# Patient Record
Sex: Male | Born: 1960 | Race: White | Hispanic: No | Marital: Married | Smoking: Current every day smoker
Health system: Southern US, Community
[De-identification: ages and names within clinical notes are randomized; demographics above are authoritative.]

## PROBLEM LIST (undated history)

## (undated) HISTORY — PX: KNEE SURGERY: SHX244

## (undated) HISTORY — PX: CHOLECYSTECTOMY: SHX55

---

## 1999-02-26 ENCOUNTER — Emergency Department (HOSPITAL_COMMUNITY): Admission: EM | Admit: 1999-02-26 | Discharge: 1999-02-26 | Payer: Self-pay | Admitting: Emergency Medicine

## 2000-12-08 ENCOUNTER — Emergency Department (HOSPITAL_COMMUNITY): Admission: EM | Admit: 2000-12-08 | Discharge: 2000-12-08 | Payer: Self-pay | Admitting: Emergency Medicine

## 2003-11-18 ENCOUNTER — Emergency Department (HOSPITAL_COMMUNITY): Admission: EM | Admit: 2003-11-18 | Discharge: 2003-11-19 | Payer: Self-pay | Admitting: Emergency Medicine

## 2003-12-30 ENCOUNTER — Emergency Department: Payer: Self-pay | Admitting: Emergency Medicine

## 2004-08-13 ENCOUNTER — Emergency Department (HOSPITAL_COMMUNITY): Admission: EM | Admit: 2004-08-13 | Discharge: 2004-08-13 | Payer: Self-pay | Admitting: Family Medicine

## 2005-03-14 ENCOUNTER — Emergency Department (HOSPITAL_COMMUNITY): Admission: EM | Admit: 2005-03-14 | Discharge: 2005-03-14 | Payer: Self-pay | Admitting: Emergency Medicine

## 2006-10-03 ENCOUNTER — Emergency Department: Payer: Self-pay | Admitting: Emergency Medicine

## 2006-10-04 ENCOUNTER — Emergency Department: Payer: Self-pay | Admitting: Emergency Medicine

## 2007-02-18 ENCOUNTER — Emergency Department (HOSPITAL_COMMUNITY): Admission: EM | Admit: 2007-02-18 | Discharge: 2007-02-19 | Payer: Self-pay | Admitting: Emergency Medicine

## 2008-01-30 ENCOUNTER — Emergency Department (HOSPITAL_COMMUNITY): Admission: EM | Admit: 2008-01-30 | Discharge: 2008-01-30 | Payer: Self-pay | Admitting: Emergency Medicine

## 2008-06-07 ENCOUNTER — Emergency Department: Payer: Self-pay | Admitting: Emergency Medicine

## 2010-11-02 ENCOUNTER — Encounter: Payer: Self-pay | Admitting: *Deleted

## 2010-11-02 ENCOUNTER — Emergency Department (HOSPITAL_COMMUNITY)
Admission: EM | Admit: 2010-11-02 | Discharge: 2010-11-02 | Disposition: A | Discharge status: Home / Self Care | Payer: BC Managed Care – PPO | Attending: Emergency Medicine | Admitting: Emergency Medicine

## 2010-11-02 DIAGNOSIS — R05 Cough: Secondary | ICD-10-CM | POA: Insufficient documentation

## 2010-11-02 DIAGNOSIS — R52 Pain, unspecified: Secondary | ICD-10-CM | POA: Insufficient documentation

## 2010-11-02 DIAGNOSIS — R059 Cough, unspecified: Secondary | ICD-10-CM | POA: Insufficient documentation

## 2010-11-02 DIAGNOSIS — R062 Wheezing: Secondary | ICD-10-CM | POA: Insufficient documentation

## 2010-11-02 DIAGNOSIS — J3489 Other specified disorders of nose and nasal sinuses: Secondary | ICD-10-CM | POA: Insufficient documentation

## 2010-11-02 DIAGNOSIS — F17200 Nicotine dependence, unspecified, uncomplicated: Secondary | ICD-10-CM | POA: Insufficient documentation

## 2010-11-02 DIAGNOSIS — R509 Fever, unspecified: Secondary | ICD-10-CM | POA: Insufficient documentation

## 2010-11-02 DIAGNOSIS — R51 Headache: Secondary | ICD-10-CM | POA: Insufficient documentation

## 2010-11-02 DIAGNOSIS — J40 Bronchitis, not specified as acute or chronic: Secondary | ICD-10-CM

## 2010-11-02 DIAGNOSIS — R070 Pain in throat: Secondary | ICD-10-CM | POA: Insufficient documentation

## 2010-11-02 MED ORDER — ALBUTEROL SULFATE HFA 108 (90 BASE) MCG/ACT IN AERS
2.0000 | INHALATION_SPRAY | Freq: Once | RESPIRATORY_TRACT | Status: AC
  Administered 2010-11-02: 2 via RESPIRATORY_TRACT
  Filled 2010-11-02: qty 6.7

## 2010-11-02 MED ORDER — AZITHROMYCIN 250 MG PO TABS: ORAL_TABLET | ORAL | Status: DC

## 2010-11-02 MED ORDER — GUAIFENESIN-CODEINE 100-10 MG/5ML PO SYRP: 10.0000 mL | ORAL_SOLUTION | Freq: Three times a day (TID) | ORAL | Status: AC | PRN

## 2010-11-02 MED ORDER — HYDROCOD POLST-CHLORPHEN POLST 10-8 MG/5ML PO LQCR
5.0000 mL | Freq: Once | ORAL | Status: AC
  Administered 2010-11-02: 5 mL via ORAL
  Filled 2010-11-02: qty 5

## 2010-11-02 NOTE — ED Notes (Signed)
Pt has multiple complaints, pt c/o body aches, headache and chills; pt states he has been coughing up thick yellow phlem

## 2010-11-02 NOTE — ED Provider Notes (Signed)
History     CSN: [id number] Arrival date & time: 11/02/2010  8:12 AM  Chief Complaint  Patient presents with  . Generalized Body Aches  . Cough  . Chills  . Headache    (Consider location/radiation/quality/duration/timing/severity/associated sxs/prior treatment) Patient is a 50 y.o. male presenting with cough and headaches. The history is provided by the patient.  Cough The current episode started more than 2 days ago. The problem occurs every few minutes. The problem has been gradually worsening. The cough is productive of sputum. There has been no fever. Associated symptoms include chills, ear congestion, headaches, rhinorrhea, sore throat, myalgias and wheezing. Pertinent negatives include no chest pain, no sweats and no shortness of breath. Treatments tried: OTC cold medication. The treatment provided no relief. He is a smoker. His past medical history is significant for bronchitis. His past medical history does not include pneumonia, COPD, emphysema or asthma.  Headache  Associated symptoms include a fever. Pertinent negatives include no shortness of breath, no nausea and no vomiting.    History reviewed. No pertinent past medical history.  Past Surgical History  Procedure Date  . Knee surgery right    History reviewed. No pertinent family history.  History  Substance Use Topics  . Smoking status: Current Everyday Smoker  . Smokeless tobacco: Not on file  . Alcohol Use: No      Review of Systems  Constitutional: Positive for fever and chills. Negative for appetite change.  HENT: Positive for congestion, sore throat and rhinorrhea. Negative for trouble swallowing, neck pain and neck stiffness.   Eyes: Negative for pain and visual disturbance.  Respiratory: Positive for cough and wheezing. Negative for chest tightness and shortness of breath.   Cardiovascular: Negative for chest pain.  Gastrointestinal: Negative for nausea, vomiting, abdominal pain and diarrhea.    Musculoskeletal: Positive for myalgias.  Skin: Negative.   Neurological: Positive for headaches. Negative for dizziness, weakness and numbness.  Hematological: Negative for adenopathy. Does not bruise/bleed easily.  All other systems reviewed and are negative.    Allergies  Review of patient's allergies indicates no known allergies.  Home Medications  No current outpatient prescriptions on file.  BP 112/73  Pulse 78  Temp(Src) 98.2 F (36.8 C) (Oral)  Resp 20  Ht 6\' 4"  (1.93 m)  Wt 210 lb (95.255 kg)  BMI 25.56 kg/m2  SpO2 97%  Physical Exam  Constitutional: He is oriented to person, place, and time. He appears well-developed and well-nourished.  Non-toxic appearance. He does not have a sickly appearance. He does not appear ill. No distress.  HENT:  Head: Normocephalic and atraumatic.  Right Ear: Tympanic membrane normal.  Left Ear: Tympanic membrane normal.  Nose: Mucosal edema present. No rhinorrhea.  Mouth/Throat: Uvula is midline. No uvula swelling. Posterior oropharyngeal erythema present. No oropharyngeal exudate, posterior oropharyngeal edema or tonsillar abscesses.  Eyes: EOM are normal. Pupils are equal, round, and reactive to light. Right eye exhibits no discharge. Left eye exhibits no discharge.  Neck: Normal range of motion. Neck supple.  Cardiovascular: Normal rate, regular rhythm and normal heart sounds.   Pulmonary/Chest: Effort normal. No respiratory distress. He has wheezes. He has no rales. He exhibits no tenderness.  Abdominal: Soft. He exhibits no distension. There is no tenderness. There is no rebound and no guarding.  Musculoskeletal: Normal range of motion.  Lymphadenopathy:    He has no cervical adenopathy.  Neurological: He is alert and oriented to person, place, and time. No cranial nerve deficit. He exhibits  normal muscle tone. Coordination normal.  Skin: Skin is warm and dry.    ED Course  Procedures (including critical care  time)       MDM    8:41 AM Vitals are stable.  NAD.  Patient is non-toxic appearing.  No meningeal signs. No tachycardia, tachypnea or hypoxia.   Few scattered expiratory wheezes and productive cough, likely bronchitis.    I will start pt on bronchodilator and abx.  He agrees to return here if sx's worsen.    Pt feels improved after observation and/or treatment in ED.         Mitchell Epling L. [place], [place] 11/02/10 [phone]

## 2010-11-02 NOTE — ED Provider Notes (Signed)
Medical screening examination/treatment/procedure(s) were performed by non-physician practitioner and as supervising physician I was immediately available for consultation/collaboration.   Forbes Cellar, MD 11/02/10 [phone]

## 2010-11-02 NOTE — ED Notes (Addendum)
C/o Headache, Generalized achiness, sore throat, bilat ear pain, cough productive of yellow secretions, back and rib pain, chills alternating with feeling warm.  Oral temp retaken  98.3   Rates overall pain an 8 on 1-10 scale.

## 2011-03-05 ENCOUNTER — Emergency Department: Payer: Self-pay | Admitting: Emergency Medicine

## 2011-11-08 ENCOUNTER — Emergency Department (HOSPITAL_COMMUNITY): Payer: BC Managed Care – PPO

## 2011-11-08 ENCOUNTER — Emergency Department (HOSPITAL_COMMUNITY)
Admission: EM | Admit: 2011-11-08 | Discharge: 2011-11-08 | Disposition: A | Discharge status: Home / Self Care | Payer: BC Managed Care – PPO | Attending: Emergency Medicine | Admitting: Emergency Medicine

## 2011-11-08 ENCOUNTER — Encounter (HOSPITAL_COMMUNITY): Payer: Self-pay | Admitting: *Deleted

## 2011-11-08 DIAGNOSIS — IMO0002 Reserved for concepts with insufficient information to code with codable children: Secondary | ICD-10-CM | POA: Insufficient documentation

## 2011-11-08 DIAGNOSIS — S86919A Strain of unspecified muscle(s) and tendon(s) at lower leg level, unspecified leg, initial encounter: Secondary | ICD-10-CM

## 2011-11-08 DIAGNOSIS — W108XXA Fall (on) (from) other stairs and steps, initial encounter: Secondary | ICD-10-CM | POA: Insufficient documentation

## 2011-11-08 MED ORDER — HYDROCODONE-ACETAMINOPHEN 5-325 MG PO TABS: 1.0000 | ORAL_TABLET | ORAL | Status: DC

## 2011-11-08 NOTE — ED Notes (Signed)
Patient with no complaints at this time. Respirations even and unlabored. Skin warm/dry. Discharge instructions reviewed with patient at this time. Patient given opportunity to voice concerns/ask questions. Patient discharged at this time and left Emergency Department with steady gait.   

## 2011-11-08 NOTE — ED Notes (Signed)
Slipped when coming down steps, twisted lt knee, pain lt knee and lower leg.

## 2011-11-08 NOTE — ED Provider Notes (Signed)
History     CSN: [id number]  Arrival date & time 11/08/11  1518   First MD Initiated Contact with Patient 11/08/11 1549      Chief Complaint  Patient presents with  . Fall    (Consider location/radiation/quality/duration/timing/severity/associated sxs/prior treatment) Patient is a 51 y.o. male presenting with fall. The history is provided by the patient.  Fall The accident occurred 3 to 5 hours ago. Incident: walking down steps. He fell from a height of 1 to 2 ft. He landed on grass. There was no blood loss. The point of impact was the right knee (right hip). The pain is present in the right hip and right knee. The pain is moderate. He was ambulatory at the scene. There was no alcohol use involved in the accident. Pertinent negatives include no fever, no abdominal pain, no bowel incontinence, no hematuria and no loss of consciousness. The symptoms are aggravated by standing. He has tried nothing for the symptoms.    History reviewed. No pertinent past medical history.  Past Surgical History  Procedure Date  . Knee surgery right    History reviewed. No pertinent family history.  History  Substance Use Topics  . Smoking status: Current Every Day Smoker  . Smokeless tobacco: Not on file  . Alcohol Use: No      Review of Systems  Constitutional: Negative for fever and activity change.       All ROS Neg except as noted in HPI  HENT: Negative for nosebleeds and neck pain.   Eyes: Negative for photophobia and discharge.  Respiratory: Negative for cough, shortness of breath and wheezing.   Cardiovascular: Negative for chest pain and palpitations.  Gastrointestinal: Negative for abdominal pain, blood in stool and bowel incontinence.  Genitourinary: Negative for dysuria, frequency and hematuria.  Musculoskeletal: Positive for arthralgias. Negative for back pain.  Skin: Negative.   Neurological: Negative for dizziness, seizures, loss of consciousness and speech difficulty.    Psychiatric/Behavioral: Negative for hallucinations and confusion.    Allergies  Naproxen; Prednisone; and Tramadol  Home Medications  No current outpatient prescriptions on file.  BP 138/80  Pulse 68  Temp 97.7 F (36.5 C) (Oral)  Resp 18  Ht 6\' 4"  (1.93 m)  Wt 205 lb (92.987 kg)  BMI 24.95 kg/m2  SpO2 98%  Physical Exam  Nursing note and vitals reviewed. Constitutional: He is oriented to person, place, and time. He appears well-developed and well-nourished.  Non-toxic appearance.  HENT:  Head: Normocephalic.  Right Ear: Tympanic membrane and external ear normal.  Left Ear: Tympanic membrane and external ear normal.  Eyes: EOM and lids are normal. Pupils are equal, round, and reactive to light.  Neck: Normal range of motion. Neck supple. Carotid bruit is not present.  Cardiovascular: Normal rate, regular rhythm, normal heart sounds, intact distal pulses and normal pulses.   Pulmonary/Chest: Breath sounds normal. No respiratory distress.  Abdominal: Soft. Bowel sounds are normal. There is no tenderness. There is no guarding.  Musculoskeletal: Normal range of motion.       There is pain to palpation and attempted range of motion of the left hip. There is pain to the posterior lateral left knee. There is soreness of the left tib-fib area. Dorsalis pedis pulse is 2+ and symmetrical. Sensory is within normal limits.  Lymphadenopathy:       Head (right side): No submandibular adenopathy present.       Head (left side): No submandibular adenopathy present.    He has  no cervical adenopathy.  Neurological: He is alert and oriented to person, place, and time. He has normal strength. No cranial nerve deficit or sensory deficit.  Skin: Skin is warm and dry.  Psychiatric: He has a normal mood and affect. His speech is normal.    ED Course  Procedures (including critical care time)  Labs Reviewed - No data to display Dg Hip Complete Left  11/08/2011  *RADIOLOGY REPORT*  Clinical  Data: Larey Seat down stairs  LEFT HIP - COMPLETE 2+ VIEW  Comparison: None.  Findings: Negative for fracture.  Normal alignment.  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  Negative for AVN.  Degenerative disc disease at L4-5.  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  Negative for fracture.   Original Report Authenticated By: Camelia Phenes, M.D.      No diagnosis found.    MDM  I have reviewed nursing notes, vital signs, and all appropriate lab and imaging results for this patient. The x-ray of the left hip and pelvis negative for fracture or dislocation. There is degenerative changes of the left hip joint. The x-ray of the left tib-fib area is negative for fracture or dislocation. The patient is fitted with a knee immobilizer and crutches. He is advised to use ice to the affected area. He is treated with Norco one every 4 hours as needed for pain #15.       Kathie Dike, [place] 11/08/11 1645

## 2011-11-09 NOTE — ED Provider Notes (Signed)
Medical screening examination/treatment/procedure(s) were performed by non-physician practitioner and as supervising physician I was immediately available for consultation/collaboration.   [place] M Lillyth Spong, MD 11/09/11 1803 

## 2012-03-26 ENCOUNTER — Emergency Department: Payer: Self-pay | Admitting: Emergency Medicine

## 2012-03-26 LAB — CBC
HCT: 46.1 % (ref 40.0–52.0)
MCH: 31.2 pg (ref 26.0–34.0)
MCHC: 34.2 g/dL (ref 32.0–36.0)
MCV: 91 fL (ref 80–100)
Platelet: 223 10*3/uL (ref 150–440)
RBC: 5.04 10*6/uL (ref 4.40–5.90)
WBC: 10.3 10*3/uL (ref 3.8–10.6)

## 2012-03-26 LAB — COMPREHENSIVE METABOLIC PANEL
Albumin: 4.1 g/dL (ref 3.4–5.0)
Alkaline Phosphatase: 68 U/L (ref 50–136)
Anion Gap: 10 (ref 7–16)
Calcium, Total: 8.9 mg/dL (ref 8.5–10.1)
Creatinine: 0.81 mg/dL (ref 0.60–1.30)
EGFR (Non-African Amer.): >60
Potassium: 3.6 mmol/L (ref 3.5–5.1)

## 2012-03-26 LAB — URINALYSIS, COMPLETE
Bacteria: NONE SEEN
Bilirubin,UR: NEGATIVE
Blood: NEGATIVE
Ketone: NEGATIVE
Leukocyte Esterase: NEGATIVE
Nitrite: NEGATIVE
Ph: 5 (ref 4.5–8.0)
Squamous Epithelial: <1
WBC UR: 2 /HPF (ref 0–5)

## 2012-03-26 LAB — LIPASE, BLOOD: Lipase: 78 U/L (ref 73–393)

## 2012-03-27 ENCOUNTER — Observation Stay: Payer: Self-pay | Admitting: Surgery

## 2012-03-28 LAB — BASIC METABOLIC PANEL
Anion Gap: 6 — ABNORMAL LOW (ref 7–16)
BUN: 14 mg/dL (ref 7–18)
Chloride: 107 mmol/L (ref 98–107)
Co2: 25 mmol/L (ref 21–32)
Creatinine: 0.8 mg/dL (ref 0.60–1.30)
Osmolality: 276 (ref 275–301)

## 2012-03-28 LAB — CBC WITH DIFFERENTIAL/PLATELET
Basophil #: 0.1 10*3/uL (ref 0.0–0.1)
Eosinophil #: 0.3 10*3/uL (ref 0.0–0.7)
Eosinophil %: 4.2 %
HGB: 14.4 g/dL (ref 13.0–18.0)
MCH: 30.9 pg (ref 26.0–34.0)
MCHC: 33.5 g/dL (ref 32.0–36.0)
MCV: 92 fL (ref 80–100)
Monocyte #: 0.7 x10 3/mm (ref 0.2–1.0)
RBC: 4.67 10*6/uL (ref 4.40–5.90)
WBC: 8.2 10*3/uL (ref 3.8–10.6)

## 2012-03-28 LAB — HEPATIC FUNCTION PANEL A (ARMC)
Albumin: 3.4 g/dL (ref 3.4–5.0)
Bilirubin, Direct: 0.1 mg/dL (ref 0.00–0.20)
Bilirubin,Total: 0.7 mg/dL (ref 0.2–1.0)
SGPT (ALT): 18 U/L (ref 12–78)
Total Protein: 6.2 g/dL — ABNORMAL LOW (ref 6.4–8.2)

## 2012-04-01 ENCOUNTER — Ambulatory Visit: Payer: Self-pay | Admitting: Surgery

## 2012-04-01 LAB — PATHOLOGY REPORT

## 2012-04-01 LAB — HEPATIC FUNCTION PANEL A (ARMC)
Alkaline Phosphatase: 80 U/L (ref 50–136)
Bilirubin, Direct: 0.1 mg/dL (ref 0.00–0.20)
SGOT(AST): 32 U/L (ref 15–37)

## 2012-04-01 LAB — LIPASE, BLOOD: Lipase: 64 U/L — ABNORMAL LOW (ref 73–393)

## 2013-01-09 ENCOUNTER — Encounter (HOSPITAL_COMMUNITY): Payer: Self-pay | Admitting: Emergency Medicine

## 2013-01-09 ENCOUNTER — Emergency Department (HOSPITAL_COMMUNITY)
Admission: EM | Admit: 2013-01-09 | Discharge: 2013-01-10 | Disposition: A | Discharge status: Home / Self Care | Payer: BC Managed Care – PPO | Attending: Emergency Medicine | Admitting: Emergency Medicine

## 2013-01-09 DIAGNOSIS — F17200 Nicotine dependence, unspecified, uncomplicated: Secondary | ICD-10-CM | POA: Insufficient documentation

## 2013-01-09 DIAGNOSIS — Z79899 Other long term (current) drug therapy: Secondary | ICD-10-CM | POA: Insufficient documentation

## 2013-01-09 DIAGNOSIS — K644 Residual hemorrhoidal skin tags: Secondary | ICD-10-CM | POA: Insufficient documentation

## 2013-01-09 DIAGNOSIS — K649 Unspecified hemorrhoids: Secondary | ICD-10-CM

## 2013-01-09 LAB — CBC WITH DIFFERENTIAL/PLATELET
Basophils Absolute: 0.1 10*3/uL (ref 0.0–0.1)
Eosinophils Absolute: 0.4 10*3/uL (ref 0.0–0.7)
Eosinophils Relative: 4 % (ref 0–5)
Lymphocytes Relative: 46 % (ref 12–46)
MCV: 93.5 fL (ref 78.0–100.0)
Neutrophils Relative %: 42 % — ABNORMAL LOW (ref 43–77)
Platelets: 241 10*3/uL (ref 150–400)
RDW: 12.9 % (ref 11.5–15.5)
WBC: 9.9 10*3/uL (ref 4.0–10.5)

## 2013-01-09 MED ORDER — PRAMOXINE HCL 1 % RE FOAM: 1.0000 "application " | Freq: Three times a day (TID) | RECTAL | Status: DC | PRN

## 2013-01-09 MED ORDER — LIDOCAINE 5 % EX OINT: 1.0000 "application " | TOPICAL_OINTMENT | Freq: Three times a day (TID) | CUTANEOUS | Status: DC | PRN

## 2013-01-09 MED ORDER — KETOROLAC TROMETHAMINE 60 MG/2ML IM SOLN
60.0000 mg | Freq: Once | INTRAMUSCULAR | Status: AC
  Administered 2013-01-09: 60 mg via INTRAMUSCULAR
  Filled 2013-01-09: qty 2

## 2013-01-09 MED ORDER — LIDOCAINE HCL 2 % EX GEL
Freq: Once | CUTANEOUS | Status: AC
  Administered 2013-01-09: 10 via TOPICAL
  Filled 2013-01-09: qty 10

## 2013-01-09 NOTE — ED Provider Notes (Signed)
CSN: [id number]     Arrival date & time 01/09/13  2208 History  This chart was scribed for Celene Kras, MD by Ardelia Mems, ED Scribe. This patient was seen in room [facility]07/[facility]07 and the patient's care was started at 10:24 PM.   Chief Complaint  Patient presents with  . Hemorrhoids    The history is provided by the patient. No language interpreter was used.    HPI Comments: Micheal [name] is a 51 y.o. male who presents to the Emergency Department complaining of external hemorrhoids. He states that he has pain to the hemorrhoids for about 2 weeks which worsened and became severe today. He states that his pain is worsened with sitting and with producing stools. He reports that his hemorrhoid began bleeding today, to the extent that he had to wear a pad over it. He reports an associated "pulling" sensation in his stomach today. He states that he has applied preparation H without relief although the pain now is better than it was earlier.  He denies syncope or any other symptoms. Pt is a current every day smoker.   History reviewed. No pertinent past medical history.  Past Surgical History  Procedure Laterality Date  . Knee surgery  right  . Cholecystectomy     History reviewed. No pertinent family history. History  Substance Use Topics  . Smoking status: Current Every Day Smoker  . Smokeless tobacco: Not on file  . Alcohol Use: No    Review of Systems  Gastrointestinal:       External hemorrhoid. "Pulling" sensation in stomach.  Neurological: Negative for syncope.  All other systems reviewed and are negative.   Allergies  Naproxen; Prednisone; and Tramadol  Home Medications   Current Outpatient Rx  Name  Route  Sig  Dispense  Refill  . ibuprofen (ADVIL,MOTRIN) 200 MG tablet   Oral   Take 800 mg by mouth every 6 (six) hours as needed.         . pantoprazole (PROTONIX) 40 MG tablet   Oral   Take 40 mg by mouth every evening.         . phenylephrine (SUDAFED PE) 10 MG  TABS tablet   Oral   Take 10 mg by mouth every 4 (four) hours as needed (for sinus relief).         . sertraline (ZOLOFT) 50 MG tablet   Oral   Take 50 mg by mouth every evening.         . lidocaine (XYLOCAINE) 5 % ointment   Topical   Apply 1 application topically 3 (three) times daily as needed.   35.44 g   0   . pramoxine (PROCTOFOAM) 1 % foam   Rectal   Place 1 application rectally 3 (three) times daily as needed for itching.   15 g   0     Triage Vitals: BP 138/85  Pulse 69  Temp(Src) 97.4 F (36.3 C)  Resp 20  Ht 6\' 4"  (1.93 m)  Wt 205 lb (92.987 kg)  BMI 24.96 kg/m2  SpO2 100%  Physical Exam  Nursing note and vitals reviewed. Constitutional: He appears well-developed and well-nourished. No distress.  HENT:  Head: Normocephalic and atraumatic.  Right Ear: External ear normal.  Left Ear: External ear normal.  Eyes: Conjunctivae are normal. Right eye exhibits no discharge. Left eye exhibits no discharge. No scleral icterus.  Neck: Neck supple. No tracheal deviation present.  Cardiovascular: Normal rate.   Pulmonary/Chest: Effort normal.  No stridor. No respiratory distress.  Genitourinary:  External hemorrhoid partially thrombosed, but with small clot externally.  Musculoskeletal: He exhibits no edema.  Neurological: He is alert. Cranial nerve deficit: no gross deficits.  Skin: Skin is warm and dry. No rash noted.  Psychiatric: He has a normal mood and affect.    ED Course  Procedures (including critical care time)  DIAGNOSTIC STUDIES: Oxygen Saturation is 100% on RA, normal by my interpretation.    COORDINATION OF CARE: 10:29 PM- Will apply Lidocaine. Will order a CBC. Pt advised of plan for treatment and pt agrees.  Medications  lidocaine (XYLOCAINE) 2 % jelly (10 application Topical Given 01/09/13 2252)   Labs Review Labs Reviewed  CBC WITH DIFFERENTIAL - Abnormal; Notable for the following:    Neutrophils Relative % 42 (*)    Lymphs Abs 4.6  (*)    All other components within normal limits   Imaging Review No results found.  EKG Interpretation   None       MDM   1. Hemorrhoids    Pt without significant bleeding.  Small external hemorrhoid with a clot.  Appears that patient has a partially thrombosed hemorrhoid that has decompressed.    I personally performed the services described in this documentation, which was scribed in my presence.  The recorded information has been reviewed and is accurate.    Celene Kras, MD 01/09/13 [phone]

## 2013-01-09 NOTE — ED Notes (Signed)
Patient present to ED with c/o hemorrhoids for about two weeks.  Bleeding from hemorrhoids today and patient felt slightly dizzy.

## 2013-01-09 NOTE — ED Notes (Signed)
Pt has had hemorrhoids x 2 weeks but today they have been bleeding "pretty bad."

## 2013-01-09 NOTE — ED Notes (Signed)
Gave patient lidocaine jelly and instructions how to use it

## 2013-07-01 ENCOUNTER — Ambulatory Visit (INDEPENDENT_AMBULATORY_CARE_PROVIDER_SITE_OTHER): Payer: PRIVATE HEALTH INSURANCE | Admitting: Family Medicine

## 2013-07-01 ENCOUNTER — Encounter: Payer: Self-pay | Admitting: Family Medicine

## 2013-07-01 VITALS — BP 128/80 | Ht 74.0 in | Wt 198.1 lb

## 2013-07-01 DIAGNOSIS — R599 Enlarged lymph nodes, unspecified: Secondary | ICD-10-CM

## 2013-07-01 DIAGNOSIS — Z0189 Encounter for other specified special examinations: Secondary | ICD-10-CM

## 2013-07-01 DIAGNOSIS — R590 Localized enlarged lymph nodes: Secondary | ICD-10-CM

## 2013-07-01 DIAGNOSIS — Z79899 Other long term (current) drug therapy: Secondary | ICD-10-CM

## 2013-07-01 DIAGNOSIS — Z125 Encounter for screening for malignant neoplasm of prostate: Secondary | ICD-10-CM

## 2013-07-01 DIAGNOSIS — Z0000 Encounter for general adult medical examination without abnormal findings: Secondary | ICD-10-CM

## 2013-07-01 MED ORDER — CLONAZEPAM 0.5 MG PO TABS: 0.5000 mg | ORAL_TABLET | Freq: Two times a day (BID) | ORAL | Status: AC | PRN

## 2013-07-01 MED ORDER — DICLOFENAC SODIUM 75 MG PO TBEC: 75.0000 mg | DELAYED_RELEASE_TABLET | Freq: Two times a day (BID) | ORAL | Status: AC

## 2013-07-01 MED ORDER — CHOLESTYRAMINE 4 G PO PACK: 4.0000 g | PACK | Freq: Two times a day (BID) | ORAL | Status: AC

## 2013-07-01 NOTE — Patient Instructions (Signed)
Smoking Cessation Quitting smoking is important to your health and has many advantages. However, it is not always easy to quit since nicotine is a very addictive drug. Often times, people try 3 times or more before being able to quit. This document explains the best ways for you to prepare to quit smoking. Quitting takes hard work and a lot of effort, but you can do it. ADVANTAGES OF QUITTING SMOKING  You will live longer, feel better, and live better.  Your body will feel the impact of quitting smoking almost immediately.  Within 20 minutes, blood pressure decreases. Your pulse returns to its normal level.  After 8 hours, carbon monoxide levels in the blood return to normal. Your oxygen level increases.  After 24 hours, the chance of having a heart attack starts to decrease. Your breath, hair, and body stop smelling like smoke.  After 48 hours, damaged nerve endings begin to recover. Your sense of taste and smell improve.  After 72 hours, the body is virtually free of nicotine. Your bronchial tubes relax and breathing becomes easier.  After 2 to 12 weeks, lungs can hold more air. Exercise becomes easier and circulation improves.  The risk of having a heart attack, stroke, cancer, or lung disease is greatly reduced.  After 1 year, the risk of coronary heart disease is cut in half.  After 5 years, the risk of stroke falls to the same as a nonsmoker.  After 10 years, the risk of lung cancer is cut in half and the risk of other cancers decreases significantly.  After 15 years, the risk of coronary heart disease drops, usually to the level of a nonsmoker.  If you are pregnant, quitting smoking will improve your chances of having a healthy baby.  The people you live with, especially any children, will be healthier.  You will have extra money to spend on things other than cigarettes. QUESTIONS TO THINK ABOUT BEFORE ATTEMPTING TO QUIT You may want to talk about your answers with your  caregiver.  Why do you want to quit?  If you tried to quit in the past, what helped and what did not?  What will be the most difficult situations for you after you quit? How will you plan to handle them?  Who can help you through the tough times? Your family? Friends? A caregiver?  What pleasures do you get from smoking? What ways can you still get pleasure if you quit? Here are some questions to ask your caregiver:  How can you help me to be successful at quitting?  What medicine do you think would be best for me and how should I take it?  What should I do if I need more help?  What is smoking withdrawal like? How can I get information on withdrawal? GET READY  Set a quit date.  Change your environment by getting rid of all cigarettes, ashtrays, matches, and lighters in your home, car, or work. Do not let people smoke in your home.  Review your past attempts to quit. Think about what worked and what did not. GET SUPPORT AND ENCOURAGEMENT You have a better chance of being successful if you have help. You can get support in many ways.  Tell your family, friends, and co-workers that you are going to quit and need their support. Ask them not to smoke around you.  Get individual, group, or telephone counseling and support. Programs are available at local hospitals and health centers. Call your local health department for   information about programs in your area.  Spiritual beliefs and practices may help some smokers quit.  Download a "quit meter" on your computer to keep track of quit statistics, such as how long you have gone without smoking, cigarettes not smoked, and money saved.  Get a self-help book about quitting smoking and staying off of tobacco. LEARN NEW SKILLS AND BEHAVIORS  Distract yourself from urges to smoke. Talk to someone, go for a walk, or occupy your time with a task.  Change your normal routine. Take a different route to work. Drink tea instead of coffee.  Eat breakfast in a different place.  Reduce your stress. Take a hot bath, exercise, or read a book.  Plan something enjoyable to do every day. Reward yourself for not smoking.  Explore interactive web-based programs that specialize in helping you quit. GET MEDICINE AND USE IT CORRECTLY Medicines can help you stop smoking and decrease the urge to smoke. Combining medicine with the above behavioral methods and support can greatly increase your chances of successfully quitting smoking.  Nicotine replacement therapy helps deliver nicotine to your body without the negative effects and risks of smoking. Nicotine replacement therapy includes nicotine gum, lozenges, inhalers, nasal sprays, and skin patches. Some may be available over-the-counter and others require a prescription.  Antidepressant medicine helps people abstain from smoking, but how this works is unknown. This medicine is available by prescription.  Nicotinic receptor partial agonist medicine simulates the effect of nicotine in your brain. This medicine is available by prescription. Ask your caregiver for advice about which medicines to use and how to use them based on your health history. Your caregiver will tell you what side effects to look out for if you choose to be on a medicine or therapy. Carefully read the information on the package. Do not use any other product containing nicotine while using a nicotine replacement product.  RELAPSE OR DIFFICULT SITUATIONS Most relapses occur within the first 3 months after quitting. Do not be discouraged if you start smoking again. Remember, most people try several times before finally quitting. You may have symptoms of withdrawal because your body is used to nicotine. You may crave cigarettes, be irritable, feel very hungry, cough often, get headaches, or have difficulty concentrating. The withdrawal symptoms are only temporary. They are strongest when you first quit, but they will go away within  10 14 days. To reduce the chances of relapse, try to:  Avoid drinking alcohol. Drinking lowers your chances of successfully quitting.  Reduce the amount of caffeine you consume. Once you quit smoking, the amount of caffeine in your body increases and can give you symptoms, such as a rapid heartbeat, sweating, and anxiety.  Avoid smokers because they can make you want to smoke.  Do not let weight gain distract you. Many smokers will gain weight when they quit, usually less than 10 pounds. Eat a healthy diet and stay active. You can always lose the weight gained after you quit.  Find ways to improve your mood other than smoking. FOR MORE INFORMATION  www.smokefree.gov  Document Released: 01/16/2001 Document Revised: 07/24/2011 Document Reviewed: 05/03/2011 [facility] Patient Information 2014 [place], [place]. Tick Bite Information Ticks are insects that attach themselves to the skin and draw blood for food. There are various types of ticks. Common types include wood ticks and deer ticks. Most ticks live in shrubs and grassy areas. Ticks can climb onto your body when you make contact with leaves or grass where the tick is waiting.  The most common places on the body for ticks to attach themselves are the scalp, neck, armpits, waist, and groin. Most tick bites are harmless, but sometimes ticks carry germs that cause diseases. These germs can be spread to a person during the tick's feeding process. The chance of a disease spreading through a tick bite depends on:   The type of tick.  Time of year.   How long the tick is attached.   Geographic location.  HOW CAN YOU PREVENT TICK BITES? Take these steps to help prevent tick bites when you are outdoors:  Wear protective clothing. Long sleeves and long pants are best.   Wear white clothes so you can see ticks more easily.  Tuck your pant legs into your socks.   If walking on a trail, stay in the middle of the trail to avoid brushing  against bushes.  Avoid walking through areas with long grass.  Put insect repellent on all exposed skin and along boot tops, pant legs, and sleeve cuffs.   Check clothing, hair, and skin repeatedly and before going inside.   Brush off any ticks that are not attached.  Take a shower or bath as soon as possible after being outdoors.  WHAT IS THE PROPER WAY TO REMOVE A TICK? Ticks should be removed as soon as possible to help prevent diseases caused by tick bites. 1. If latex gloves are available, put them on before trying to remove a tick.  2. Using fine-point tweezers, grasp the tick as close to the skin as possible. You may also use curved forceps or a tick removal tool. Grasp the tick as close to its head as possible. Avoid grasping the tick on its body. 3. Pull gently with steady upward pressure until the tick lets go. Do not twist the tick or jerk it suddenly. This may break off the tick's head or mouth parts. 4. Do not squeeze or crush the tick's body. This could force disease-carrying fluids from the tick into your body.  5. After the tick is removed, wash the bite area and your hands with soap and water or other disinfectant such as alcohol. 6. Apply a small amount of antiseptic cream or ointment to the bite site.  7. Wash and disinfect any instruments that were used.  Do not try to remove a tick by applying a hot match, petroleum jelly, or fingernail polish to the tick. These methods do not work and may increase the chances of disease being spread from the tick bite.  WHEN SHOULD YOU SEEK MEDICAL CARE? Contact your health care provider if you are unable to remove a tick from your skin or if a part of the tick breaks off and is stuck in the skin.  After a tick bite, you need to be aware of signs and symptoms that could be related to diseases spread by ticks. Contact your health care provider if you develop any of the following in the days or weeks after the tick  bite:  Unexplained fever.  Rash. A circular rash that appears days or weeks after the tick bite may indicate the possibility of Lyme disease. The rash may resemble a target with a bull's-eye and may occur at a different part of your body than the tick bite.  Redness and swelling in the area of the tick bite.   Tender, swollen lymph glands.   Diarrhea.   Weight loss.   Cough.   Fatigue.   Muscle, joint, or bone pain.  Abdominal pain.   Headache.   Lethargy or a change in your level of consciousness.  Difficulty walking or moving your legs.   Numbness in the legs.   Paralysis.  Shortness of breath.   Confusion.   Repeated vomiting.  Document Released: 01/20/2000 Document Revised: 11/12/2012 Document Reviewed: 07/02/2012 [facility] Patient Information 2014 [place].

## 2013-07-01 NOTE — Progress Notes (Signed)
Subjective:    Patient ID: Micheal [name], male    DOB: [birth year], 53 y.o.   MRN: [id number]  HPI Patient is here today for his annual wellness exam. Patient states he has right knee pain that has been present for years. Has had surgeries done in the past. Patient states he has no other concerns at this time.  The patient comes in today for a wellness visit.  A review of their health history was completed.  A review of medications was also completed. Any necessary refills were discussed. Sensible healthy diet was discussed. Importance of minimizing excessive salt and carbohydrates was also discussed. Safety was stressed including driving, activities at work and at home where applicable. Importance of regular physical activity for overall health was discussed. Preventative measures appropriate for age were discussed. Time was spent with the patient discussing any concerns they have about their well-being.   Review of Systems  Constitutional: Negative for fever, activity change and appetite change.  HENT: Negative for congestion and rhinorrhea.   Eyes: Negative for discharge.  Respiratory: Negative for cough and wheezing.   Cardiovascular: Negative for chest pain.  Gastrointestinal: Negative for vomiting, abdominal pain and blood in stool.  Genitourinary: Negative for frequency and difficulty urinating.  Musculoskeletal: Negative for neck pain.  Skin: Negative for rash.  Allergic/Immunologic: Negative for environmental allergies and food allergies.  Neurological: Negative for weakness and headaches.  Psychiatric/Behavioral: Negative for agitation.       Objective:   Physical Exam  Constitutional: He appears well-developed and well-nourished.  HENT:  Head: Normocephalic and atraumatic.  Right Ear: External ear normal.  Left Ear: External ear normal.  Nose: Nose normal.  Mouth/Throat: Oropharynx is clear and moist.  Eyes: EOM are normal. Pupils are equal, round, and reactive  to light.  Neck: Normal range of motion. Neck supple. No thyromegaly present.  Cardiovascular: Normal rate, regular rhythm and normal heart sounds.   No murmur heard. Pulmonary/Chest: Effort normal and breath sounds normal. No respiratory distress. He has no wheezes.  Abdominal: Soft. Bowel sounds are normal. He exhibits no distension and no mass. There is no tenderness.  Genitourinary: Penis normal.  Musculoskeletal: Normal range of motion. He exhibits no edema.  Lymphadenopathy:    He has no cervical adenopathy.  Neurological: He is alert. He exhibits normal muscle tone.  Skin: Skin is warm and dry. No erythema.  Psychiatric: He has a normal mood and affect. His behavior is normal. Judgment normal.          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 he may use over-the-counter anti-inflammatory on further discussion we went ahead and called in bold tearing twice a day he will followup in 4 weeks to let us know how that is doing  #2 he has a small lymph node on the left side of his neck which he states appeared 8 months ago it is hard to tell if it is a lymph node or small cyst I recommend that we recheck it in 4 weeks' time if any changes or if persistent may need referral to ENT for removal  #3 patient was warned to quit smoking to reduce his risk of cancer he is tried Wellbutrin without success and states he cannot afford Chantix he will try to quit on his own  #4 anxiety condition Klonopin was refilled he is to followup if ongoing troubles  #5 he was counseled  to get a colonoscopy as she was given to him. He is to followup in one years time for a wellness exam

## 2013-07-27 ENCOUNTER — Ambulatory Visit: Payer: PRIVATE HEALTH INSURANCE | Admitting: Family Medicine

## 2013-12-11 LAB — LIPID PANEL
Cholesterol: 164 mg/dL (ref 0–200)
HDL: 41 mg/dL (ref >39)
LDL CALC: 67 mg/dL (ref 0–99)
Total CHOL/HDL Ratio: 4 Ratio
Triglycerides: 281 mg/dL — ABNORMAL HIGH (ref <150)
VLDL: 56 mg/dL — AB (ref 0–40)

## 2013-12-11 LAB — BASIC METABOLIC PANEL
BUN: 12 mg/dL (ref 6–23)
CHLORIDE: 108 meq/L (ref 96–112)
CO2: 22 meq/L (ref 19–32)
CREATININE: 0.76 mg/dL (ref 0.50–1.35)
Calcium: 9.4 mg/dL (ref 8.4–10.5)
Glucose, Bld: 99 mg/dL (ref 70–99)
POTASSIUM: 4 meq/L (ref 3.5–5.3)
SODIUM: 139 meq/L (ref 135–145)

## 2013-12-11 LAB — HEPATIC FUNCTION PANEL
ALBUMIN: 4.2 g/dL (ref 3.5–5.2)
ALK PHOS: 62 U/L (ref 39–117)
ALT: 16 U/L (ref 0–53)
AST: 19 U/L (ref 0–37)
BILIRUBIN TOTAL: 0.5 mg/dL (ref 0.2–1.2)
Bilirubin, Direct: 0.1 mg/dL (ref 0.0–0.3)
Indirect Bilirubin: 0.4 mg/dL (ref 0.2–1.2)
TOTAL PROTEIN: 6.7 g/dL (ref 6.0–8.3)

## 2013-12-11 LAB — PSA: PSA: 0.71 ng/mL (ref <=4.00)

## 2013-12-14 ENCOUNTER — Encounter: Payer: Self-pay | Admitting: Family Medicine

## 2014-04-08 ENCOUNTER — Telehealth: Payer: Self-pay | Admitting: Family Medicine

## 2014-04-08 NOTE — Telephone Encounter (Signed)
Notified patients spouse of him and his son being discharged from the practice.

## 2014-05-28 NOTE — H&P (Signed)
PATIENT NAME:  Micheal Micheal [name], Micheal Micheal [name] MR#:  [id number] DATE OF BIRTH:  [birth year]  DATE OF ADMISSION:  03/27/2012  PRIMARY CARE PHYSICIAN: Micheal SchimkeMeindert Micheal [name] Niemeyer, MD  ADMITTING PHYSICIAN:  Quentin Orealph L. Ely, III, MD   CHIEF COMPLAINT: Abdominal pain.   BRIEF HISTORY: The patient is Micheal [name] 54 year old gentleman seen in the Emergency Room with abdominal pain consistent with biliary tract disease. He had been having symptoms for the last several weeks, which intensified over the last 24 hours.  He was seen in the Emergency Room last evening. Workup at that time demonstrated gallstones with soft evidence for acute cholecystitis. No change in liver function studies and no elevation of white blood cell count. He had some mild nausea, but did not vomit. He was sent home on pain medicine and was set up to be seen in the office for further evaluation. He continued to have discomfort over the evening and his symptoms progressed through the course of the day. He presented to the Emergency Room for further evaluation.   He denies any previous history of biliary tract disease. He was told he had gallstones many years ago, but has not had symptoms until the recent episodes. He denies history of hepatitis, yellow jaundice, pancreatitis, peptic ulcer disease or diverticulitis. He has no previous abdominal surgery. His only previous surgery was right knee arthroscopy. Denies any history of cardiac disease, hypertension, diabetes or thyroid disease.   CURRENT MEDICATIONS: Include hyoscyamine and Vicodin 5/325. He is listed on the chart as being on prednisone, but he denies taking any steroid medications currently.   ALLERGIES:  He has no medical allergies.   SOCIAL HISTORY: He is Micheal [name] cigarette smoker, smoking Micheal [name] pack and Micheal [name] half of cigarettes Micheal [name] day. Drinks alcohol occasionally. He works for [name].   REVIEW OF SYSTEMS: Otherwise unremarkable. Micheal [name] 10-point review of systems carried out with the patient and otherwise demonstrates no  significant symptoms or complaints.   FAMILY HISTORY: Noncontributory.   PHYSICAL EXAMINATION: GENERAL: He is an alert, pleasant gentleman on his phone at the time of initial evaluation.  VITAL SIGNS: Blood pressure 133/73, pulse is 66 and regular, temperature is 97, pain scale is Micheal [name] 6, oxygen saturation is 96% on room air.  HEENT: Unremarkable. No scleral icterus. No pupillary abnormalities. No facial deformities.  NECK: Supple with no tenderness, with midline trachea and no adenopathy.  CHEST: Clear with no adventitious sounds and he has normal pulmonary excursion.  CARDIAC: No murmurs or gallops. He seems to be in normal sinus rhythm.  ABDOMEN: Soft with some mild right upper quadrant, midepigastric tenderness. He has some mild guarding with no rebound. He has active bowel sounds. No hernias or masses are noted.  EXTREMITIES: Full range of motion. No deformities and good distal pulses.  PSYCHIATRIC: Normal orientation, normal affect.   IMPRESSION: This gentleman has biliary colic, possibly acute cholecystitis with the extent of the duration of his symptoms. We will plan to admit him to the hospital, place him on IV antibiotics, rehydration and discuss surgical intervention. Risks, benefits, and options have been outlined to him in detail. Surgical options have been outlined and accepted.    ____________________________ Carmie Endalph L. Ely III, MD rle:cc D: 03/27/2012 17:04:18 ET T: 03/27/2012 18:06:19 ET JOB#: [id number]  cc: Quentin Orealph L. Ely III, MD, <Dictator> Meindert Micheal [name]. Lacie ScottsNiemeyer, MD  Quentin OreALPH L ELY MD ELECTRONICALLY SIGNED 03/28/2012 17:20

## 2014-05-28 NOTE — Op Note (Signed)
PATIENT NAME:  Micheal [name], Micheal [name] MR#:  [id number] DATE OF BIRTH:  1960/11/25  DATE OF PROCEDURE:  03/29/2012  SURGEON: Cristal Deerhristopher [name]. Tashonda Pinkus, MD  PREOPERATIVE DIAGNOSIS: Acute cholecystitis.   POSTOPERATIVE DIAGNOSIS: Symptomatic cholelithiasis.   PROCEDURE PERFORMED: Laparoscopic cholecystectomy.   ESTIMATED BLOOD LOSS: 25 mL.   ANESTHESIA: General endotracheal.   COMPLICATIONS: None.   SPECIMENS: Gallbladder.   INDICATION FOR SURGERY: The patient is [name] pleasant 54 year old male with [name] history of gallstones and right upper quadrant pain who was admitted for possible cholecystitis. He was brought to the operating room for laparoscopic cholecystectomy.   DETAILS OF PROCEDURE AS FOLLOWS: Informed consent was obtained. The patient was brought into the operating room suite. He was laid supine on the operating room table. His abdomen was prepped and draped in standard surgical fashion. [name] timeout was then performed, correctly identifying the patient's name, operative site and procedure to be performed. [name] supraumbilical incision was made. This was deepened down to the fascia. The fascia was incised. The peritoneum was entered. There were no underlying adhesions. Two stay sutures were placed through the fascia. The Hasson trocar was placed in the abdomen. The abdomen was insufflated. [name] camera was placed through the trocar. The gallbladder was visualized. It was noted to be blue, but otherwise not horribly inflamed. An 11 mm epigastric and two 5 mm ports were placed 2 cm below the costal margin at the midclavicular and anterior axillary lines. The gallbladder was lifted by its fundus over the dome of the liver. The cystic artery and cystic duct were dissected out. The critical view was obtained. Three clips were placed across the artery and duct, and they were each ligated. The gallbladder was then taken off the gallbladder fossa using cautery. The gallbladder was taken out. The gallbladder fossa was  examined. Hemostasis was obtained using cautery. After the abdomen was irrigated and the gallbladder fossa was hemostatic, the trocars were removed under direct visualization. The supraumbilical fascia was closed with the 0 Vicryl figure-of-eight. The remaining port sites were closed using interrupted inverted deep dermal Monocryl sutures. Steri-Strips were then placed across each wound. Telfa gauze and Tegaderm were then used to complete the dressing. The patient was awoken, extubated and brought to the postanesthesia care unit. There were no immediate complications. Needle, sponge and instrument counts were correct at the end of the procedure.   ____________________________ Si Raiderhristopher [name]. Shealee Yordy, MD cal:OSi D: 03/29/2012 01:30:08 ET T: 03/29/2012 12:30:09 ET JOB#: [id number]  cc: Cristal Deerhristopher [name]. Rawan Riendeau, MD, <Dictator> Jarvis NewcomerHRISTOPHER [name] Artesia Berkey MD ELECTRONICALLY SIGNED 04/01/2012 8:16

## 2014-10-15 IMAGING — US ABDOMEN ULTRASOUND LIMITED
1 series · 14 of 25 positions shown · non-contrast
Comparison: none

REASON FOR EXAM: epigastric pain worse after eating
COMMENTS:   Body Site: GB and Fossa, CBD, Head of Pancreas

[Series 1: abdomen ultrasound limited · 0.28mm/px · 14 of 81 slices shown]
[im 1/81]
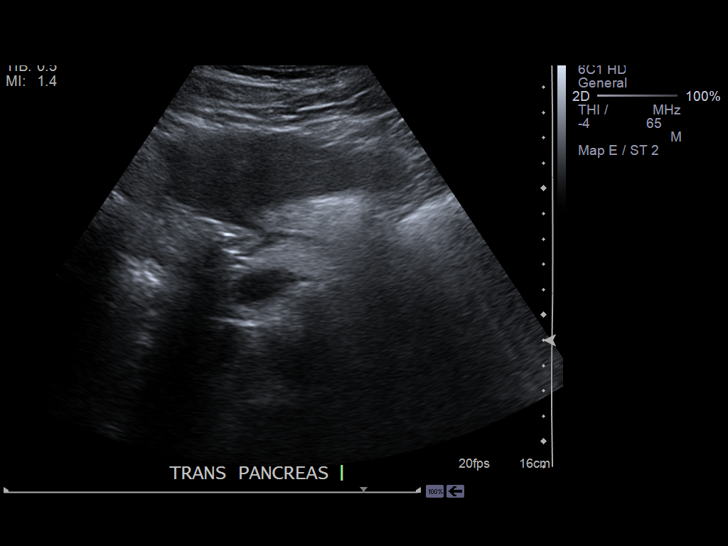
[im 7/81]
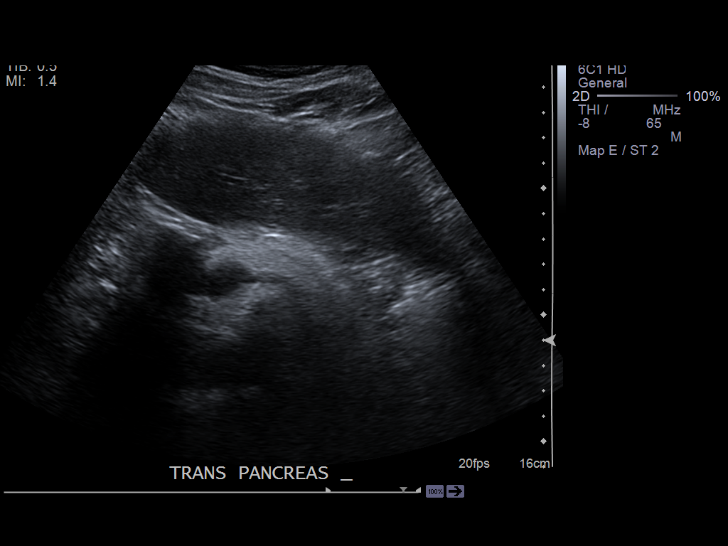
[im 14/81]
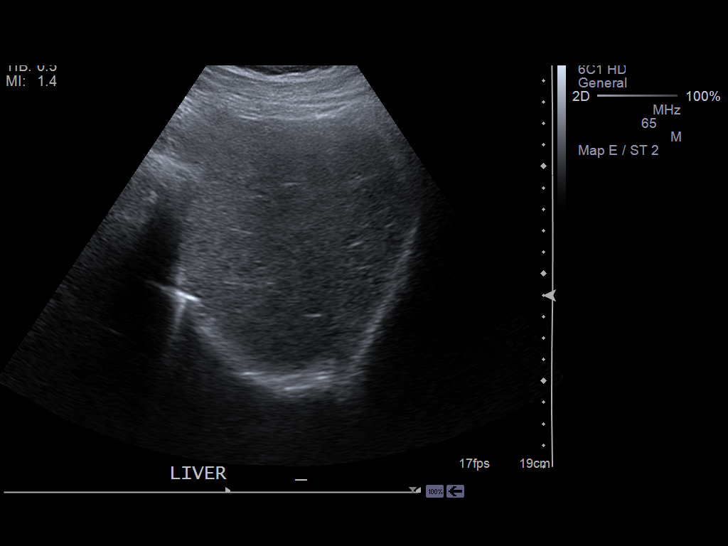
[im 21/81]
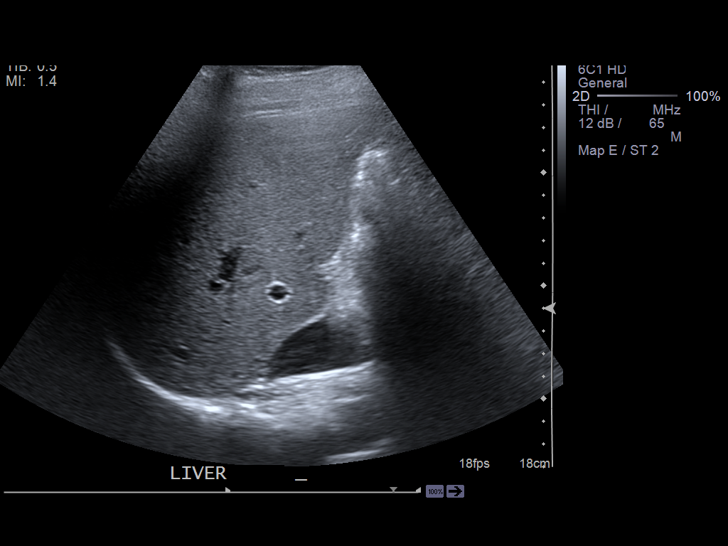
[im 27/81]
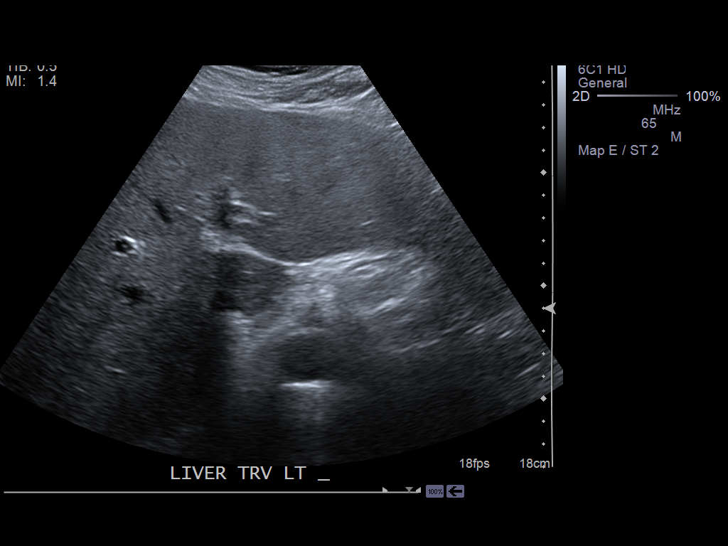
[im 31/81]
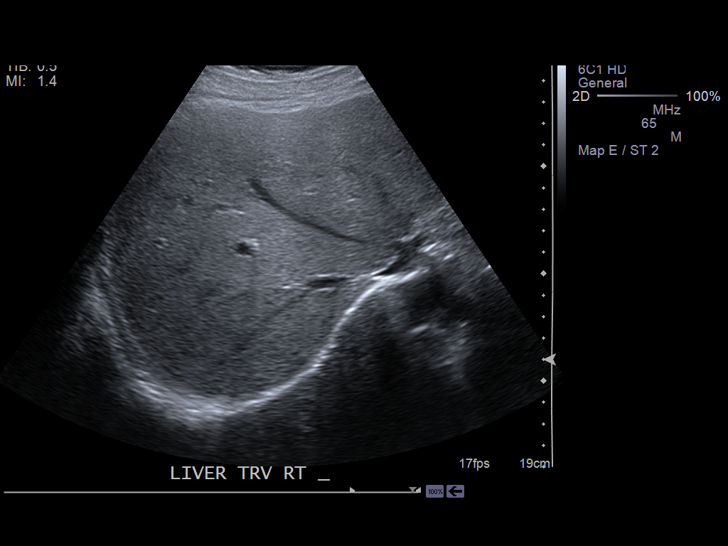
[im 37/81]
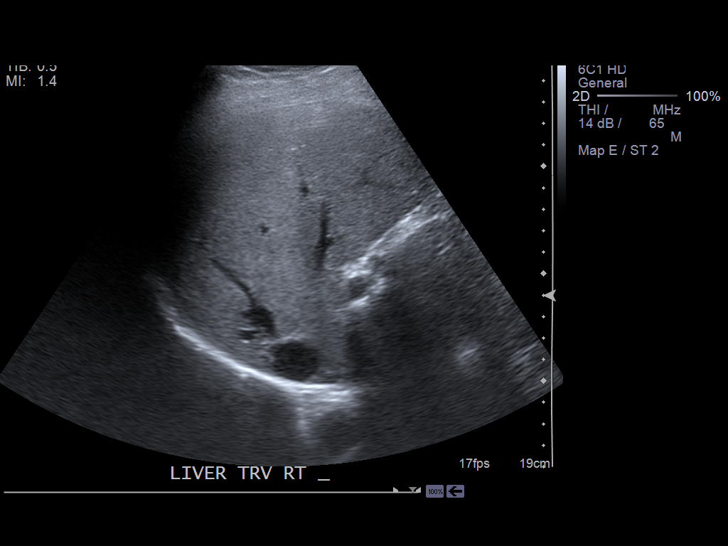
[im 44/81]
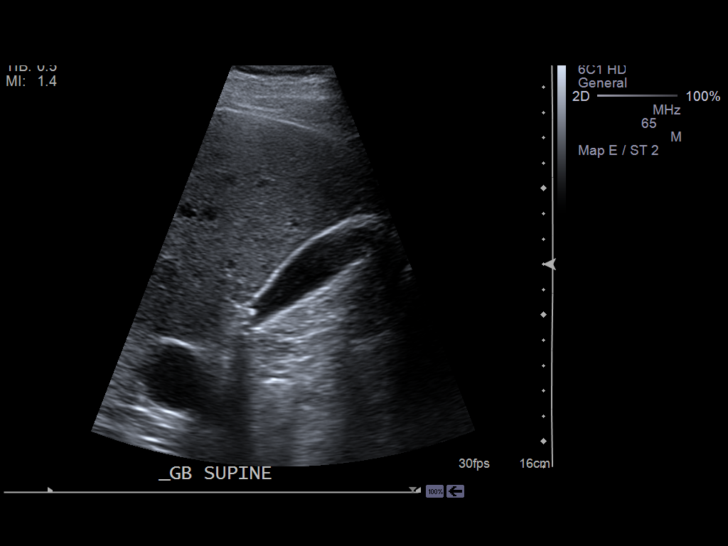
[im 51/81]
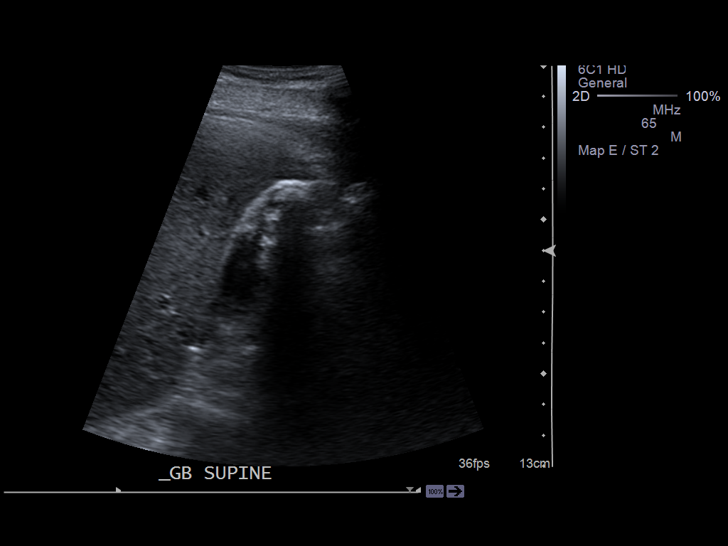
[im 54/81]
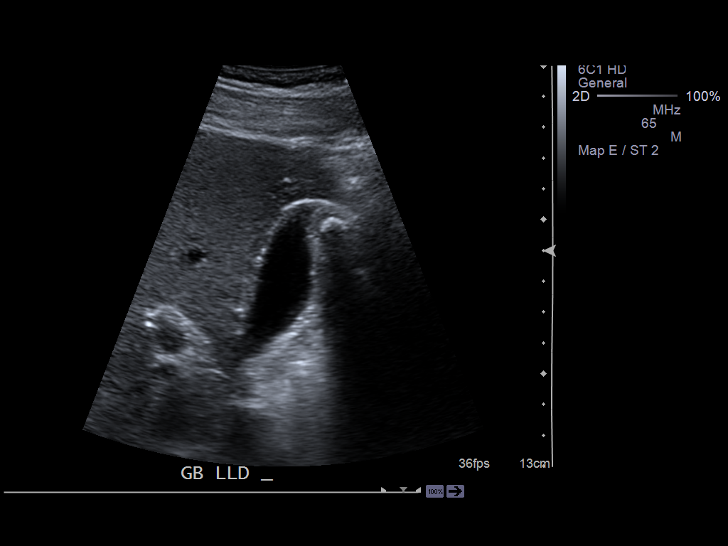
[im 61/81]
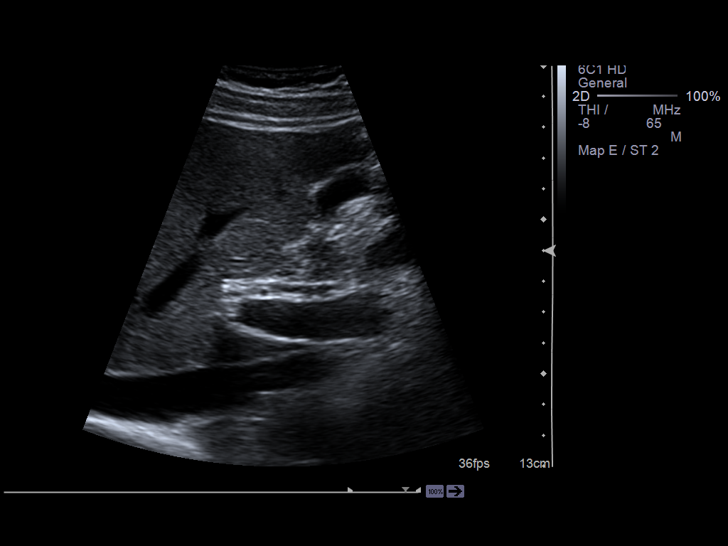
[im 67/81]
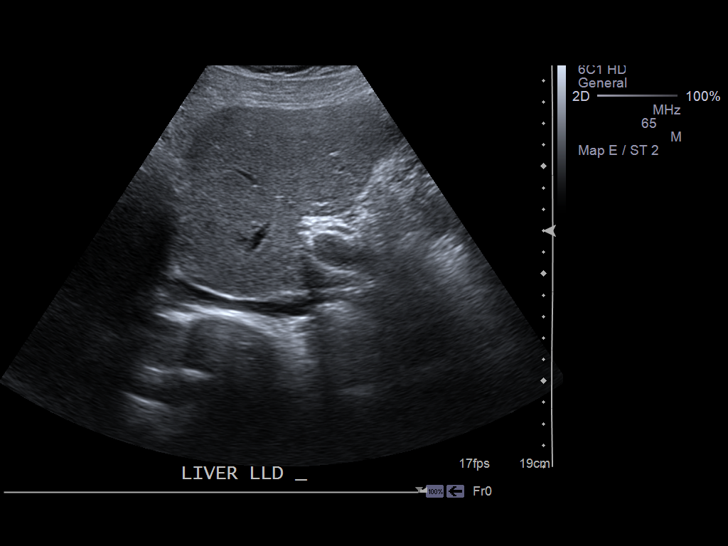
[im 74/81]
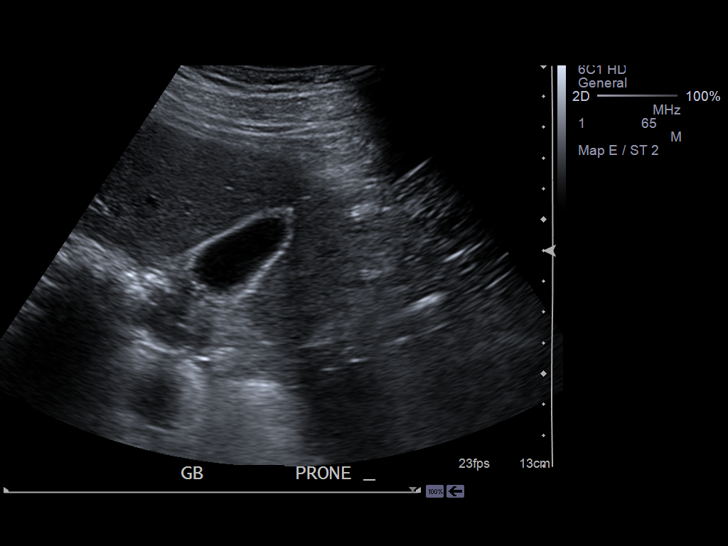
[im 81/81]
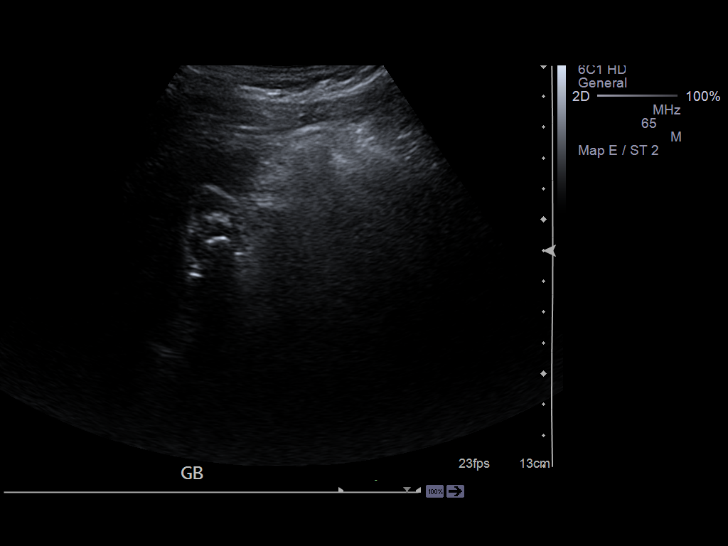

[14 of 25 positions shown; findings below may reference images not displayed]

PROCEDURE:     US  - US ABDOMEN LIMITED SURVEY  - March 26, 2012  [DATE]

RESULT:     The liver length is 15.43 cm. The hepatic echotexture is normal.
Portal venous flow is normal. There is limited visualization of the pancreas
with the visualized portion appearing unremarkable. Portions of the tail and
head are not well seen. Cholelithiasis is present. The gallbladder wall
thickness is 2.8 mm. There is a negative sonographic Murphy's sign reported
by the technologist. The common bile duct diameter is 2.8 mm.
IMPRESSION: 1. Cholelithiasis. No definite sonographic evidence of acute cholecystitis.

[REDACTED]

## 2017-04-05 ENCOUNTER — Other Ambulatory Visit: Payer: Self-pay

## 2017-04-05 ENCOUNTER — Emergency Department
Admission: EM | Admit: 2017-04-05 | Discharge: 2017-04-05 | Disposition: A | Discharge status: Home / Self Care | Payer: Worker's Compensation | Attending: Emergency Medicine | Admitting: Emergency Medicine

## 2017-04-05 ENCOUNTER — Encounter: Payer: Self-pay | Admitting: Emergency Medicine

## 2017-04-05 ENCOUNTER — Emergency Department: Payer: Worker's Compensation

## 2017-04-05 DIAGNOSIS — S62632B Displaced fracture of distal phalanx of right middle finger, initial encounter for open fracture: Secondary | ICD-10-CM

## 2017-04-05 DIAGNOSIS — Y939 Activity, unspecified: Secondary | ICD-10-CM | POA: Insufficient documentation

## 2017-04-05 DIAGNOSIS — Y990 Civilian activity done for income or pay: Secondary | ICD-10-CM | POA: Diagnosis not present

## 2017-04-05 DIAGNOSIS — Z79899 Other long term (current) drug therapy: Secondary | ICD-10-CM | POA: Diagnosis not present

## 2017-04-05 DIAGNOSIS — W230XXA Caught, crushed, jammed, or pinched between moving objects, initial encounter: Secondary | ICD-10-CM | POA: Diagnosis not present

## 2017-04-05 DIAGNOSIS — S67192A Crushing injury of right middle finger, initial encounter: Secondary | ICD-10-CM | POA: Diagnosis present

## 2017-04-05 DIAGNOSIS — Y9259 Other trade areas as the place of occurrence of the external cause: Secondary | ICD-10-CM | POA: Insufficient documentation

## 2017-04-05 DIAGNOSIS — F17200 Nicotine dependence, unspecified, uncomplicated: Secondary | ICD-10-CM | POA: Insufficient documentation

## 2017-04-05 DIAGNOSIS — R52 Pain, unspecified: Secondary | ICD-10-CM

## 2017-04-05 MED ORDER — LIDOCAINE HCL (PF) 1 % IJ SOLN
5.0000 mL | Freq: Once | INTRAMUSCULAR | Status: AC
  Administered 2017-04-05: 5 mL

## 2017-04-05 MED ORDER — OXYCODONE-ACETAMINOPHEN 5-325 MG PO TABS: 1.0000 | ORAL_TABLET | Freq: Four times a day (QID) | ORAL | 0 refills | Status: AC | PRN

## 2017-04-05 MED ORDER — LIDOCAINE HCL (PF) 1 % IJ SOLN
INTRAMUSCULAR | Status: AC
  Administered 2017-04-05: 5 mL
  Filled 2017-04-05: qty 5

## 2017-04-05 MED ORDER — SULFAMETHOXAZOLE-TRIMETHOPRIM 800-160 MG PO TABS: 1.0000 | ORAL_TABLET | Freq: Two times a day (BID) | ORAL | 0 refills | Status: AC

## 2017-04-05 NOTE — Discharge Instructions (Signed)
Keep finger splinted and bandaged until evaluation by orthopedics.

## 2017-04-05 NOTE — ED Notes (Signed)
This RN asked for pt supervisors number- Suan HalterShane Phillips [phone]. He stated that for workers comp pt will need to complete a urine drug screen.

## 2017-04-05 NOTE — ED Provider Notes (Signed)
[facility] Emergency Department Provider Note   ____________________________________________   First MD Initiated Contact with Patient 04/05/17 1344     (approximate)  I have reviewed the triage vital signs and the nursing notes.   HISTORY  Chief Complaint Finger Injury    HPI Micheal [name] is a 57 y.o. male patient presents with pain ,dema, and laceration to distal third digit right hand.  Patient finger was smashed in a vault at work.  Patient denies loss of sensation or loss of function of the affected digit.  Patient is right-hand dominant.  Patient rates the pain as a 6/10.  Patient described the pain is "aching".  No palates measured prior to arrival.  Patient states tetanus shot is up-to-date.  History reviewed. No pertinent past medical history.  There are no active problems to display for this patient.   Past Surgical History:  Procedure Laterality Date  . CHOLECYSTECTOMY    . KNEE SURGERY  right    Prior to Admission medications   Medication Sig Start Date End Date Taking? Authorizing Provider  cholestyramine (QUESTRAN) 4 G packet Take 1 packet (4 g total) by mouth 2 (two) times daily. 07/01/13   Babs Sciara, MD  clonazePAM (KLONOPIN) 0.5 MG tablet Take 1 tablet (0.5 mg total) by mouth 2 (two) times daily as needed for anxiety. 07/01/13   Babs Sciara, MD  diclofenac (VOLTAREN) 75 MG EC tablet Take 1 tablet (75 mg total) by mouth 2 (two) times daily. 07/01/13   Babs Sciara, MD  gabapentin (NEURONTIN) 400 MG capsule Take 400 mg by mouth 3 (three) times daily as needed.    [provider]  oxyCODONE-acetaminophen (PERCOCET) 5-325 MG tablet Take 1 tablet by mouth every 6 (six) hours as needed for severe pain. 04/05/17   Joni Reining, PA-C  sulfamethoxazole-trimethoprim (BACTRIM DS,SEPTRA DS) 800-160 MG tablet Take 1 tablet by mouth 2 (two) times daily. 04/05/17   Joni Reining, PA-C    Allergies Naproxen; Prednisone; and  Tramadol  History reviewed. No pertinent family history.  Social History Social History   Tobacco Use  . Smoking status: Current Every Day Smoker  . Smokeless tobacco: Never Used  Substance Use Topics  . Alcohol use: No  . Drug use: No    Review of Systems Constitutional: No fever/chills Eyes: No visual changes. ENT: No sore throat. Cardiovascular: Denies chest pain. Respiratory: Denies shortness of breath. Gastrointestinal: No abdominal pain.  No nausea, no vomiting.  No diarrhea.  No constipation. Genitourinary: Negative for dysuria. Musculoskeletal: Pain edema obvious edema.. Skin: Negative for rash.  Small laceration palmar aspect of the third digit left hand. Neurological: Negative for headaches, focal weakness or numbness. Allergic/Immunilogical: See medication list ____________________________________________   PHYSICAL EXAM:  VITAL SIGNS: ED Triage Vitals  Enc Vitals Group     BP 04/05/17 1301 (!) 149/84     Pulse Rate 04/05/17 1301 84     Resp 04/05/17 1301 16     Temp 04/05/17 1301 97.9 F (36.6 C)     Temp Source 04/05/17 1301 Oral     SpO2 04/05/17 1301 95 %     Weight 04/05/17 1252 205 lb (93 kg)     Height 04/05/17 1252 6\' 4"  (1.93 m)     Head Circumference --      Peak Flow --      Pain Score 04/05/17 1252 6     Pain Loc --  Pain Edu? --      Excl. in GC? --    Constitutional: Alert and oriented. Well appearing and in no acute distress. Cardiovascular: Normal rate, regular rhythm. Grossly normal heart sounds.  Good peripheral circulation. Respiratory: Normal respiratory effort.  No retractions. Lungs CTAB. Musculoskeletal: No obvious deformity to the third digit right hand.  Obvious edema.  Patient has full and equal range of motion of the affected digit. Neurologic:  Normal speech and language. No gross focal neurologic deficits are appreciated. No gait instability. Skin:  Skin is warm, dry and intact. No rash noted. Psychiatric: Mood and  affect are normal. Speech and behavior are normal.  ____________________________________________   LABS (all labs ordered are listed, but only abnormal results are displayed)  Labs Reviewed - No data to display ____________________________________________  EKG   ____________________________________________  RADIOLOGY  ED MD interpretation: Revealed mildly displaced and comminuted fracture distal phalanges third digit right hand.  Official radiology report(s): Dg Hand Complete Right  Result Date: 04/05/2017 CLINICAL DATA:  Injury to right hand today. EXAM: RIGHT HAND - COMPLETE 3+ VIEW COMPARISON:  None. FINDINGS: Displaced/comminuted fracture of the distal phalanx of the right third finger. No extension seen to the underlying articular surface. Alignment at the underlying DIP joint is normal. No additional fracture seen. IMPRESSION: Slightly displaced fracture, likely comminuted, within the distal phalanx of the third finger. Electronically Signed   By: Bary RichardStan  Maynard M.D.   On: 04/05/2017 13:30    ____________________________________________   PROCEDURES  Procedure(s) performed: None  .Splint Application Date/Time: 04/05/2017 2:05 PM Performed by: Marguerita Merleshomas, David C, NT Authorized by: Joni ReiningSmith, Angelino Rumery K, PA-C   Consent:    Consent obtained:  Verbal   Consent given by:  Patient   Risks discussed:  Numbness, pain and swelling Pre-procedure details:    Sensation:  Normal Procedure details:    Laterality:  Right   Location:  Finger   Finger:  R long finger   Strapping: no     Cast type:  Finger   Supplies:  Aluminum splint Post-procedure details:    Pain:  Unchanged   Sensation:  Normal   Patient tolerance of procedure:  Tolerated well, no immediate complications    Critical Care performed: No  ____________________________________________   INITIAL IMPRESSION / ASSESSMENT AND PLAN / ED COURSE  As part of my medical decision making, I reviewed the following data  within the electronic MEDICAL RECORD NUMBER    Open fracture third digit right hand.  Discussed x-ray findings with patient.  Patient was thoroughly cleaned and irrigated.  Laceration was Steri-Stripped.  Patient placed in a splint.  Patient given prescription consisting of Bactrim DS, ibuprofen, and Percocet.  Patient advised to follow orthopedics by calling for an appointment.     ____________________________________________   FINAL CLINICAL IMPRESSION(S) / ED DIAGNOSES  Final diagnoses:  Open displaced fracture of distal phalanx of right middle finger, initial encounter     ED Discharge Orders        Ordered    sulfamethoxazole-trimethoprim (BACTRIM DS,SEPTRA DS) 800-160 MG tablet  2 times daily     04/05/17 1418    oxyCODONE-acetaminophen (PERCOCET) 5-325 MG tablet  Every 6 hours PRN     04/05/17 1418       Note:  This document was prepared using Dragon voice recognition software and may include unintentional dictation errors.    Joni ReiningSmith, Kingstyn Deruiter K, PA-C 04/05/17 1421    Schaevitz, Myra Rudeavid Matthew, MD 04/05/17 1435

## 2017-04-05 NOTE — ED Triage Notes (Addendum)
Pt here after 3rd digit right hand got smashed under vault of sometime, works for [employer].  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  [employer] is [employer]  Possible open fracture. Small laceration noted.

## 2017-04-05 NOTE — ED Notes (Addendum)
Urine drug screen performed by medic D. Maisie Fushomas

## 2017-04-05 NOTE — ED Notes (Signed)
See triage note  Presents with injury to right hand  States it was smashed in a vault

## 2019-10-25 IMAGING — DX DG HAND COMPLETE 3+V*R*
3 series · 3 of 3 positions shown · non-contrast
Comparison: None.

CLINICAL DATA: Injury to right hand today.

EXAM:
RIGHT HAND - COMPLETE 3+ VIEW

[hand ap]
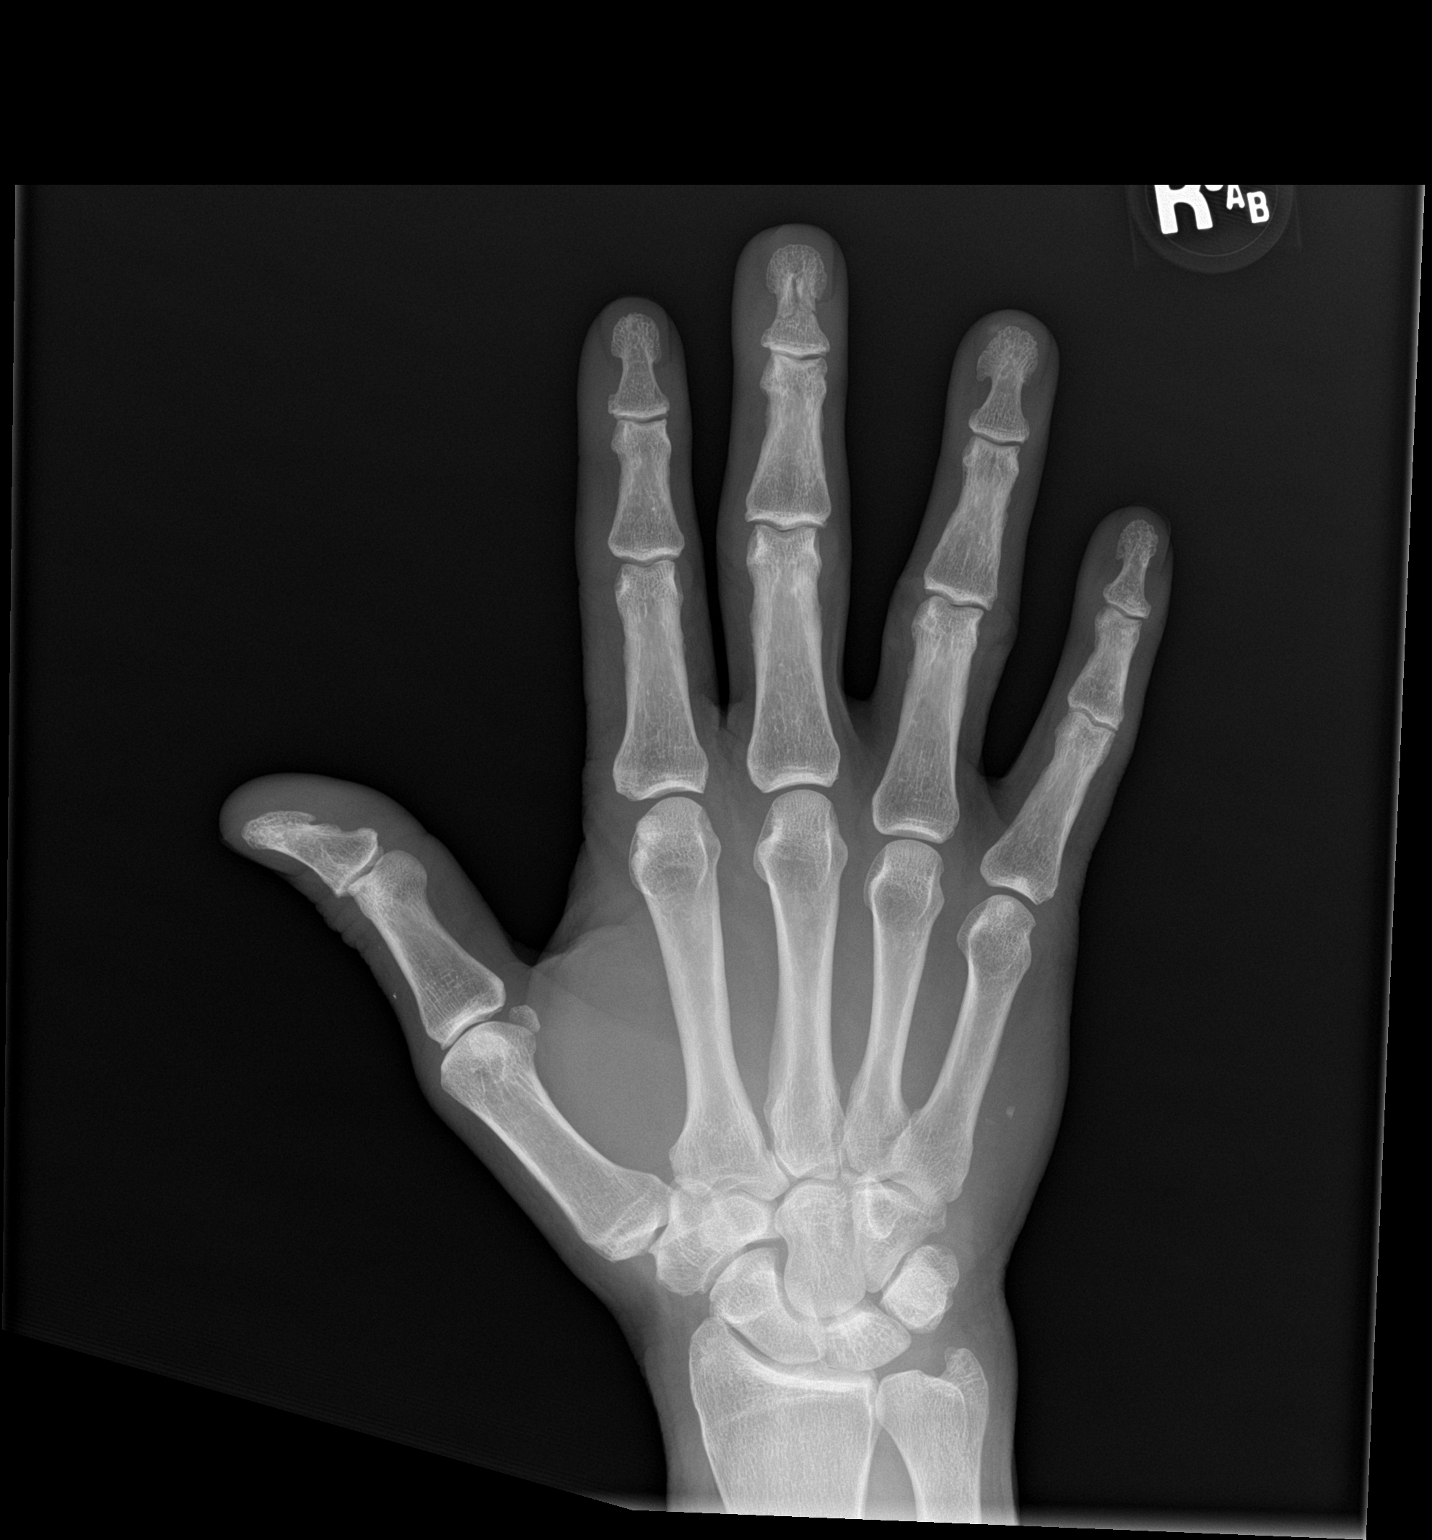

[hand obl]
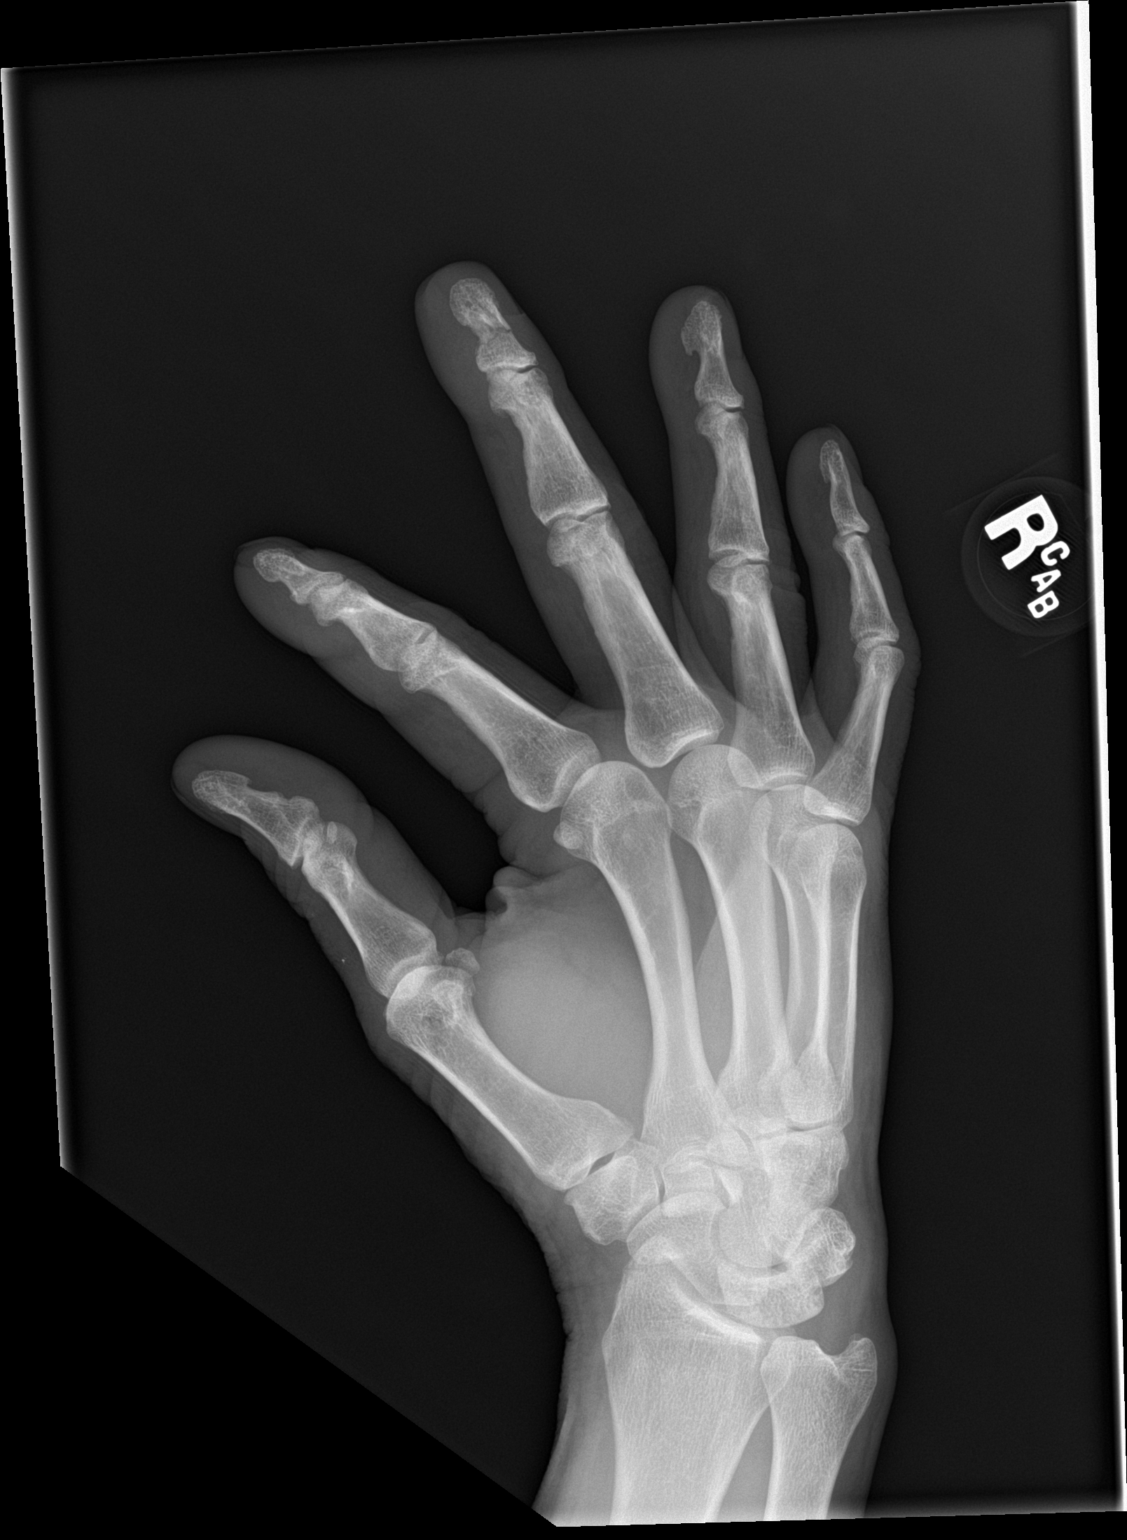

[hand lat]
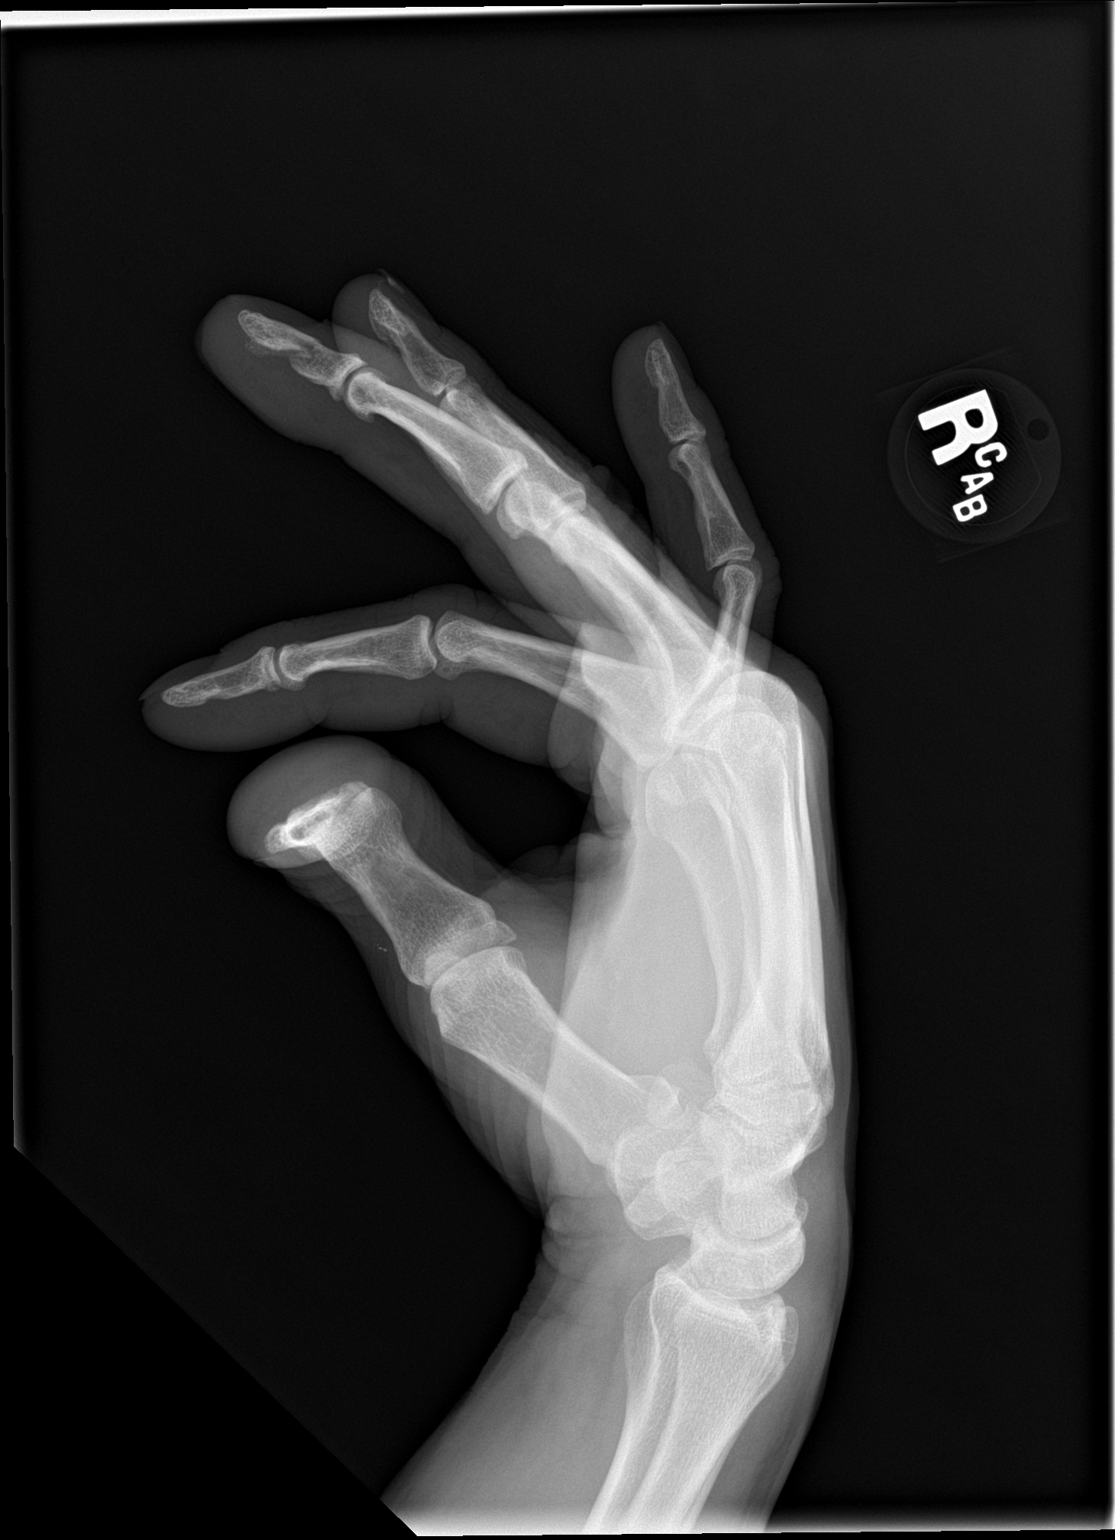

[3 of 3 positions shown; findings below may reference images not displayed]

FINDINGS: Displaced/comminuted fracture of the distal phalanx of the right
third finger. No extension seen to the underlying articular surface.
Alignment at the underlying DIP joint is normal. No additional
fracture seen.
IMPRESSION: Slightly displaced fracture, likely comminuted, within the distal
phalanx of the third finger.
